# Patient Record
Sex: Male | Born: 1960 | Race: Black or African American | Hispanic: No | Marital: Married | Smoking: Never smoker
Health system: Southern US, Community
[De-identification: ages and names within clinical notes are randomized; demographics above are authoritative.]

---

## 1998-11-10 ENCOUNTER — Emergency Department (HOSPITAL_COMMUNITY): Admission: EM | Admit: 1998-11-10 | Discharge: 1998-11-10 | Payer: Self-pay | Admitting: Internal Medicine

## 2000-03-26 ENCOUNTER — Inpatient Hospital Stay (HOSPITAL_COMMUNITY): Admission: EM | Admit: 2000-03-26 | Discharge: 2000-03-28 | Payer: Self-pay | Admitting: Emergency Medicine

## 2000-03-26 ENCOUNTER — Encounter: Payer: Self-pay | Admitting: Emergency Medicine

## 2000-03-27 ENCOUNTER — Encounter: Payer: Self-pay | Admitting: Internal Medicine

## 2003-03-24 ENCOUNTER — Emergency Department (HOSPITAL_COMMUNITY): Admission: EM | Admit: 2003-03-24 | Discharge: 2003-03-24 | Payer: Self-pay | Admitting: Emergency Medicine

## 2003-03-24 ENCOUNTER — Encounter: Payer: Self-pay | Admitting: Emergency Medicine

## 2005-03-12 ENCOUNTER — Ambulatory Visit: Payer: Self-pay | Admitting: Internal Medicine

## 2005-10-30 ENCOUNTER — Ambulatory Visit: Payer: Self-pay | Admitting: Internal Medicine

## 2007-01-14 ENCOUNTER — Ambulatory Visit: Payer: Self-pay | Admitting: Family Medicine

## 2007-08-12 ENCOUNTER — Ambulatory Visit: Payer: Self-pay | Admitting: Internal Medicine

## 2007-08-15 LAB — CONVERTED CEMR LAB
ALT: 18 units/L (ref 0–53)
AST: 18 units/L (ref 0–37)
BUN: 7 mg/dL (ref 6–23)
Basophils Absolute: 0 10*3/uL (ref 0.0–0.1)
Basophils Relative: 0.3 % (ref 0.0–1.0)
CO2: 31 meq/L (ref 19–32)
Calcium: 9.2 mg/dL (ref 8.4–10.5)
Chloride: 101 meq/L (ref 96–112)
Cholesterol: 201 mg/dL (ref 0–200)
Creatinine, Ser: 1 mg/dL (ref 0.4–1.5)
Direct LDL: 126.9 mg/dL
Eosinophils Absolute: 0.1 10*3/uL (ref 0.0–0.6)
Eosinophils Relative: 1.6 % (ref 0.0–5.0)
GFR calc Af Amer: 103 mL/min
GFR calc non Af Amer: 85 mL/min
Glucose, Bld: 99 mg/dL (ref 70–99)
HCT: 43.5 % (ref 39.0–52.0)
HDL: 54.4 mg/dL (ref >39.0)
Hemoglobin: 14.7 g/dL (ref 13.0–17.0)
Lymphocytes Relative: 24.9 % (ref 12.0–46.0)
MCHC: 33.9 g/dL (ref 30.0–36.0)
MCV: 89 fL (ref 78.0–100.0)
Monocytes Absolute: 0.6 10*3/uL (ref 0.2–0.7)
Monocytes Relative: 9.2 % (ref 3.0–11.0)
Neutro Abs: 4.4 10*3/uL (ref 1.4–7.7)
Neutrophils Relative %: 64 % (ref 43.0–77.0)
PSA: 0.63 ng/mL (ref 0.10–4.00)
Platelets: 255 10*3/uL (ref 150–400)
Potassium: 3.9 meq/L (ref 3.5–5.1)
RBC: 4.89 M/uL (ref 4.22–5.81)
RDW: 12.9 % (ref 11.5–14.6)
Sodium: 138 meq/L (ref 135–145)
TSH: 1.34 microintl units/mL (ref 0.35–5.50)
Total CHOL/HDL Ratio: 3.7
Triglycerides: 89 mg/dL (ref 0–149)
VLDL: 18 mg/dL (ref 0–40)
WBC: 6.8 10*3/uL (ref 4.5–10.5)

## 2008-03-30 ENCOUNTER — Encounter (INDEPENDENT_AMBULATORY_CARE_PROVIDER_SITE_OTHER): Payer: Self-pay | Admitting: *Deleted

## 2008-06-13 ENCOUNTER — Ambulatory Visit: Payer: Self-pay | Admitting: Internal Medicine

## 2008-06-13 DIAGNOSIS — J069 Acute upper respiratory infection, unspecified: Secondary | ICD-10-CM | POA: Insufficient documentation

## 2008-06-15 ENCOUNTER — Encounter (INDEPENDENT_AMBULATORY_CARE_PROVIDER_SITE_OTHER): Payer: Self-pay | Admitting: *Deleted

## 2008-08-28 ENCOUNTER — Encounter (INDEPENDENT_AMBULATORY_CARE_PROVIDER_SITE_OTHER): Payer: Self-pay | Admitting: *Deleted

## 2008-08-31 ENCOUNTER — Encounter: Payer: Self-pay | Admitting: Internal Medicine

## 2008-09-14 ENCOUNTER — Telehealth (INDEPENDENT_AMBULATORY_CARE_PROVIDER_SITE_OTHER): Payer: Self-pay | Admitting: *Deleted

## 2008-10-05 ENCOUNTER — Telehealth (INDEPENDENT_AMBULATORY_CARE_PROVIDER_SITE_OTHER): Payer: Self-pay | Admitting: *Deleted

## 2008-10-30 ENCOUNTER — Telehealth (INDEPENDENT_AMBULATORY_CARE_PROVIDER_SITE_OTHER): Payer: Self-pay | Admitting: *Deleted

## 2008-11-02 ENCOUNTER — Telehealth (INDEPENDENT_AMBULATORY_CARE_PROVIDER_SITE_OTHER): Payer: Self-pay | Admitting: *Deleted

## 2011-04-24 NOTE — H&P (Signed)
[place]  Patient:    Chris Flores, Chris Flores                         MRN: [id number] Adm. Date:  [id number] Attending:  Lorre Nick CC:         Meredith Staggers, M.D.                         History and Physical  DATE OF BIRTH:  [birth year]  PROBLEM LIST: 1. Sepsis versus severe dehydration associated with hypotension. 2. Pulmonary infiltrates of unknown etiology.    a. Infectious versus bronchitis obliterans with organized pneumonia versus       vasculitis versus pneumonitis/acute lung injury (autoimmune), congestive       heart failure, sarcoid, versus marked atypical malignancy. 3. Atypical chest pain, rule out myocardial infarction.    a. Probable pulmonary and/or gastrointestinal etiology. 4. Dehydration. 5. Diarrhea. 6. Hypokalemia secondary to #4 and #5.  CHIEF COMPLAINT:  Chest pain, cough.  HISTORY OF PRESENT ILLNESS:  Chris Flores is a very pleasant 50 year old male who woke up this morning with substernal chest pain radiating to the back.  The patient was brought to the emergency department, where concerns about source with acute MI were considered.  A cardiologist evaluated the patient, who thought that the patients symptoms were not cardiac.  The emergency department obtained a chest CT scan and CT of the lower extremities, which revealed no deep venous thrombosis nor central pulmonary emboli.  Instead, this imaging study showed bibasilar ground glass opacities with bronchi thickening.  Prior to the CT scan, the patient became hypotensive with a systolic blood pressure in the upper 80s associated with bradycardia.  The patient also threw up twice in the emergency department.  After fluid resuscitation, the patients blood pressure improved.  Then, we were called to see this patient for further evaluation and admission.  Chris Flores describes about a three- to four-week history of cough.  The cough was mostly dry though occasionally he  was able to produce yellowish sputum.  In the last two to three weeks, the patient describes no fever or chills.  He describes some diarrhea for the last three days.  This diarrhea was mostly watery.  He describes about three episodes per day.  No melena, tarry stools, or bright red blood per rectum.  No hemoptysis or hematemesis.  No night sweats.  No weight loss.  No sinus congestion.  The patient denies skin rash or synovitis. He is not aware of being exposed to anybody who could have tuberculosis.  He denies IV drug abuse, blood transfusion, or sexual risk factors.  He denies headache or neck stiffness.  He describes some shortness of breath over the last two weeks.  He also describes somewhat worsening cough at night with questionable orthopnea.  The chest pain that he presented with was mostly associated with deep inspiration.  This chest pain was nonexertional.  No history of diabetes, hypertension, smoking.  No family medical history significant for coronary artery disease.  He does not know what his lipid profile is.  No recent lower extremity edema nor trauma.  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  He has not had any work-related chemical exposures that he knows of.  PAST MEDICAL HISTORY:  As problem list.  ALLERGIES:  None.  CURRENT MEDICATIONS:  None.  FAMILY HISTORY:  Significant for colon cancer (mother), lymphoma (grandmother).  No coronary artery disease, hypertension, strokes in the family.  His grandmother had diabetes.  SOCIAL HISTORY:  The patient is married and has three children ages 70, 27, and 1.  He denies smoking, alcohol use, or illegal drug use.  He works as a Museum/gallery exhibitions officer.  REVIEW OF SYSTEMS:  As in the HPI.  No focal weakness, swallowing problems, blurred vision, presyncope, or syncope.  PHYSICAL EXAMINATION:  VITAL SIGNS:  Temperature 98.2, blood  pressure 194/54, heart rate 54, respirations 28, oxygen saturation 96% on 2 L.  Repeat blood pressure after IV fluids 110/60, heart rate 79.  HEENT:  Normocephalic, atraumatic.  Non-icteric sclerae.  Conjunctivae within normal limits.  PERRLA, EOMI.  Funduscopic exam negative for papilledema or hemorrhages.  TMs within normal limits.  Oropharynx clear.  Mucous membranes dry.  NECK:  Supple.  No JVD, no bruits, no adenopathy, no thyromegaly.  SKIN:  Without rash.  JOINTS:  Without synovitis.  LUNGS:  Very fine rales at the bases.  Otherwise, negative.  No wheezing. Fair air movement bilaterally.  CARDIAC:  Regular rate and rhythm without murmurs, rubs, or gallops.  Normal S1, S2.  ABDOMEN:  Flat, nontender, nondistended.  Bowel sounds were present.  No hepatosplenomegaly.  No bruits, no masses, no rebound.  GU:  Within normal limits  RECTAL:  Deferred.  EXTREMITIES:  No edema, clubbing, or cyanosis.  Pulses 2+ bilaterally.  NEUROLOGIC:  Alert and oriented x 3.  Strength 5/5 in all extremities.  DTRs 3/5 in all extremities.  Sensory intact.  Cranial nerves II-XII intact. Plantar reflexes downgoing bilaterally.  LABORATORY DATA:  Hemoglobin 14.4, MCV 85, WBC 11.5, absolute neutrophil count 6.8, absolute lymphocyte count 3.5, eosinophils 3% (absolute eosinophil count 0.3).  Creatinine 1.0.  CK-MB negative, troponin-I negative.  Glucose 123, BUN 9, sodium 141, potassium 3.2, chloride 102, CO2 34.  EKG consistent with sinus bradycardia with a ventricular rate of 54.  Normal axis.  No ST segment changes.  No Q waves.  Early R wave progression.  There is a J point elevation from V3-6.  Chest x-ray consistent with bibasilar streaky infiltrates.  Chest CT scan consistent with no evidence of pulmonary emboli or DVT in the lower extremities.  There is bibasilar ground glass opacities with mid airway thickening.  ASSESSMENT AND PLAN: 1. Sepsis versus severe dehydration--as described  in the history of present illness section, the patient presented with hypotension and bradycardia.  At  this point, the patients hypotension has resolved with fluid resuscitation. Severe hypovolemic shock plus or minus septicemia and sepsis due to the pulmonary infiltrates are the most likely diagnosis.  The patient is going to be admitted to a telemetry bed for further workup and close monitoring. Please see plan in problem #2.  2. Bilateral pulmonary infiltrates--the differential diagnosis includes infectious process, which the most likely diagnosis would be atypical pneumonia.  The microorganisms associated with atypical pneumonias are tuberculosis, fungi, Mycoplasma, H flu, and human immunodeficiency virus. Bacterial community acquired pneumonia is less likely.  Other etiologies to be considered are bronchitis obliterans with organized pneumonia, vasculitis, pneumonitis with acute lung injury associated with an autoimmune process, sarcoid.  Other etiologies less likely are congestive heart failure and atypical malignancy.  We will admit the patient to a telemetry bed for supportive care.  Blood cultures are being obtained x 2.  A sputum sample for gram stain, culture,  acid-fast bacilli, and fungal studies also will be obtained.  An antinuclear antibody, sedimentation rate, and LDH also will be obtained.  A human immunodeficiency virus enzyme-linked immunosorbent assay will be checked today.  A purified protein derivative skin test will also be placed.  We will follow up the patients chest x-ray in the morning.  In the meantime, we will use empiric intravenous antibiotic therapy, bronchodilator treatments.  A pulmonary consult will be obtained in light of the atypical presentation of these pulmonary infiltrates.  3. Chest pain--the chest symptoms are very atypical for a cardiac source. Cardiac enzymes and EKGs are negative.  The probable sources for the chest pain symptoms are likely  to be pulmonary or gastrointestinal.  For now, we will pursue the workup described in problem #2 and we will use proton pump inhibitors in the meantime.  As described above, there is no evidence of pulmonary emboli, nor does the patient have any cardiac risk factors.  4. Dehydration--the physical exam is consistent with moderate to severe degree of dehydration.  We will start intravenous hydration therapy.  The fluid balance will be monitored daily, as well as the orthostatic blood pressures.  5. Hypokalemia--the basis for this low serum potassium level are dehydration and diarrhea.  We will go ahead and start potassium supplementation and recheck the serum potassium level in the morning.  6. Diarrhea--it is unclear if the diarrhea is associated at this point with the pulmonary infiltrates.  The patient denies any recent exposure to antibiotics.  No foreign trips.  We will follow clinically and, if the patient relapses, we will obtain studies to rule out infectious colitis. DD:  03/26/00 TD:  03/26/00 Job: [id number]

## 2011-04-24 NOTE — Discharge Summary (Signed)
[place]. [facility]  Patient:    Chris Flores, Chris Flores                           MRN: [id number] Adm. Date:  03/26/00 Disc. Date: 03/28/00 Attending:  Thora Lance, M.D. CC:         Rosanne Sack, M.D.             Danice Goltz, M.D.             Meredith Staggers, M.D.                           Discharge Summary  REASON FOR ADMISSION:  A 50 year old white male who woke up the morning of admission with substernal chest pain radiating to the back.  He complained of shortness of breath and a cough which was mostly dry although occasionally able to produce some yellowish sputum.  He denied any fever or chills.  He had had some diarrhea which was mainly watery.  He did report some mild shortness of breath over the 2 weeks prior to admission.  The chest pain was associated with deep inspiration.  SIGNIFICANT FINDINGS:  Temperature 98.2, blood pressure 94/54, heart rate 54, respirations 28, oxygen saturation 96% on 2 liters.  HEENT:  Unremarkable.  NECK:  Normal.  LUNGS:  Showed fine rales at the bases otherwise negative without wheezing.  HEART:  Cardiac unremarkable.  ABDOMEN:  Unremarkable.  EXTREMITIES:  Normal.  NEUROLOGIC:  Normal.  LABORATORY DATA:  CBC:  WBC 11.5, hemoglobin 14.4, platelet count 296.  Sodium 141, potassium 3.2, chloride 102, bicarbonate 30, glucose 123, BUN 9, creatinine 1.0, calcium 9.0, total protein 6.4, albumin 3.1, AST 18, ALT 14, alkaline phosphatase 44, total bilirubin 1.6, ______ 201.  Lipase 18.  CK 180, CK-MB less than 0.3, troponin I less than 0.03.  Chest x-ray showed minimal bibasilar atelectasis versus infiltrate and low lung volumes.  HOSPITAL COURSE: #1 - PULMONARY INFILTRATES:  The patient was admitted with shortness of breath, chest pain and cough.  A CT scan of the chest was done on the day of admission and showed bibasilar ill-defined ground-glass opacities.  There was no evidence of pulmonary  embolus.  A CT scan of the lower extremities showed no evidence of DVT.  Other differential was broad and felt to include atypical acute pneumonia, pulmonary edema, and inflammatory causes such as BOOP and DIP.  The patient was started on levaquin IV.  Pulmonary consultation was obtained.  They outlined the differential as noted above.  They recommended an echocardiogram to rule out cardiac dysfunction but this was not completed prior to discharge.  They agree with the antibiotics.  The patient had an HIV which was nonreactive.  The sed rate was 3.  Blood cultures remained no growth.  Urinary lesion ______ was pending.  Other pending labs included an ANA and sputum cultures.  A repeat chest x-ray showed no change.  The patient was noted to have some bronchospasm which responded to nebulized bronchodilators.  On the second hospital day on antibiotics he was feeling much better.  His blood gas on room air looked good with a pH of 7.36, PCO2 of 44, and PO2 of 80. The ambulated in the halls and IV fluids were discontinued.  He tolerated his diet well.  By March 28, 2000, after 2 days of antibiotics, he was feeling back to baseline with  resolution of most of his symptoms and less weakness.  He was discharged in good condition.  DISCHARGE DIAGNOSES: 1. Atypical pneumonia. 2. Hypokalemia. 3. Hypotension. 4. Diarrhea. 5. Mild dehydration.  PROCEDURES: 1. CT scan of the chest. 2. CT scan of the lower extremities.  DISCHARGE MEDICATIONS:  Levaquin 500 mg p.o. q. day for 1 week.  DIET:  Regular.  ACTIVITY:  As tolerated.  FOLLOWUP:  The patient was asked to follow up with Dr. Jayme Cloud of [facility] pulmonology in 2 weeks. DD:  03/29/00 TD:  03/29/00 Job: [id number]

## 2011-04-24 NOTE — Consult Note (Signed)
[place]. [facility]  Patient:    Chris Flores, Chris Flores                         MRN: [id number] Adm. Date:  [id number] Attending:  Lorre Nick Dictator:   Jesse Sans. Wall, M.D. [facility]                          Consultation Report  Dr. Bruce Donath asked me to see this patient at 7 a.m.  HISTORY OF PRESENT ILLNESS:  Chris Flores is a 50 year old black male who awoke suddenly at 4 a.m. with a well localized sharp mid substernal chest pain.  According to his wife, he has had about a three week of an upper respiratory tract infection with severe coughing.  It hurts to take a deep breath.  In the emergency room, he was found to be in distress, difficult to take a history from, and his vital signs were stable.  His EKG showed a question of early repolarization versus an acute anterior infarct.  PAST MEDICAL HISTORY:  He has no cardiac risk factors.  He has had no exertional angina or exertional ischemic symptoms.  He denies any nausea, vomiting, hematochezia, hematemesis, or melena.  PHYSICAL EXAMINATION:  GENERAL:  When I arrived, he was quite comfortable laying on the stretcher with his legs crossed.  VITAL SIGNS:  Blood pressure of 130/80, pulse 64 in sinus rhythm.  SKIN:  Warm and dry.  NECK:  Carotid upstrokes were equal bilaterally without bruits.  CHEST:  No obvious tenderness.  It hurt to take a deep breath.  CARDIAC:  A normal S1 and a split S2.  There was no rub.  He was listened to upright and there was no rub.  LUNGS:  Good breath sounds throughout.  There was no rub.  ABDOMEN:  Soft.  EXTREMITIES:  No edema.  Pulses were present.  LABORATORY DATA:  His chest x-ray showed a normal cardiac silhouette with a question of some atelectasis in the right base.  His EKG was checked several times.  It shows normal sinus rhythm with early repolarization changes in almost every single lead.  There was no PR depression.  There was no obvious low voltage.   There was no electrical alternans.  Laboratory data showed an [facility] that showed a potassium of 3.2.  His pH was 7.3 with PCO2 of 61.  His hemoglobin was 15.  ASSESSMENT: 1. Musculoskeletal chest pain.  Doubt cardiac etiology. 2. Right atelectasis on his chest x-ray, probably from not taking deep breath    from pleuritic or musculoskeletal chest pain from an upper respiratory    tract infection. 3. Hypokalemia. 4. Unexplained acidosis with CO2 retention.  RECOMMENDATIONS: 1. The patient does not need to go to the cath lab for emergent    catheterization. 2. We will pursue other etiologies other than cardiac chest pain. 3. I would recommend the patient gets a PA and lateral chest x-ray.  I do not    think an echocardiogram will be helpful. DD:  03/26/00 TD:  03/26/00 Job: [id number]

## 2012-03-15 ENCOUNTER — Encounter (HOSPITAL_BASED_OUTPATIENT_CLINIC_OR_DEPARTMENT_OTHER): Payer: Self-pay | Admitting: Emergency Medicine

## 2012-03-15 ENCOUNTER — Emergency Department (HOSPITAL_BASED_OUTPATIENT_CLINIC_OR_DEPARTMENT_OTHER)
Admission: EM | Admit: 2012-03-15 | Discharge: 2012-03-15 | Disposition: A | Discharge status: Home / Self Care | Payer: BC Managed Care – PPO | Attending: Emergency Medicine | Admitting: Emergency Medicine

## 2012-03-15 ENCOUNTER — Emergency Department (INDEPENDENT_AMBULATORY_CARE_PROVIDER_SITE_OTHER): Payer: BC Managed Care – PPO

## 2012-03-15 DIAGNOSIS — R5381 Other malaise: Secondary | ICD-10-CM

## 2012-03-15 DIAGNOSIS — R059 Cough, unspecified: Secondary | ICD-10-CM

## 2012-03-15 DIAGNOSIS — R05 Cough: Secondary | ICD-10-CM

## 2012-03-15 DIAGNOSIS — R509 Fever, unspecified: Secondary | ICD-10-CM

## 2012-03-15 DIAGNOSIS — J189 Pneumonia, unspecified organism: Secondary | ICD-10-CM | POA: Insufficient documentation

## 2012-03-15 DIAGNOSIS — J3489 Other specified disorders of nose and nasal sinuses: Secondary | ICD-10-CM | POA: Insufficient documentation

## 2012-03-15 DIAGNOSIS — R918 Other nonspecific abnormal finding of lung field: Secondary | ICD-10-CM

## 2012-03-15 DIAGNOSIS — R5383 Other fatigue: Secondary | ICD-10-CM

## 2012-03-15 MED ORDER — AZITHROMYCIN 250 MG PO TABS: 500.0000 mg | ORAL_TABLET | Freq: Every day | ORAL | Status: AC

## 2012-03-15 MED ORDER — AZITHROMYCIN 250 MG PO TABS
500.0000 mg | ORAL_TABLET | Freq: Once | ORAL | Status: AC
  Administered 2012-03-15: 500 mg via ORAL
  Filled 2012-03-15: qty 2

## 2012-03-15 NOTE — Discharge Instructions (Signed)

## 2012-03-15 NOTE — ED Notes (Signed)
Prescript called in for Albuteral and areo chamber at [facility] [address] [employer]

## 2012-03-15 NOTE — ED Notes (Signed)
Pt c/o cough, chest congestion, and fever x 2 days. Pt states he took tylenol prior to coming.

## 2012-03-15 NOTE — ED Provider Notes (Addendum)
History     CSN: [id number]  Arrival date & time 03/15/12  2034   First MD Initiated Contact with Patient 03/15/12 2239      Chief Complaint  Patient presents with  . URI    (Consider location/radiation/quality/duration/timing/severity/associated sxs/prior treatment) HPI The patient presents with cough, congestion, fever, generalized discomfort.  His symptoms began gradually approximately 3 or 4 days ago, and has become more persistent over the past 2 days.  He notes that he has not achieved significant relief with any OTC medication.  He denies any confusion, disorientation, ataxia, chest pain when not and coughing spells.  He also denies any nausea, vomiting, diarrhea.  History reviewed. No pertinent past medical history.  History reviewed. No pertinent past surgical history.  No family history on file.  History  Substance Use Topics  . Smoking status: Never Smoker   . Smokeless tobacco: Not on file  . Alcohol Use: No      Review of Systems  Constitutional:       Per HPI, otherwise negative  HENT:       Per HPI, otherwise negative  Eyes: Negative.   Respiratory:       Per HPI, otherwise negative  Cardiovascular:       Per HPI, otherwise negative  Gastrointestinal: Negative for vomiting.  Genitourinary: Negative.   Musculoskeletal:       Per HPI, otherwise negative  Skin: Negative.   Neurological: Negative for syncope.    Allergies  Review of patient's allergies indicates no known allergies.  Home Medications   Current Outpatient Rx  Name Route Sig Dispense Refill  . ACETAMINOPHEN 500 MG PO TABS Oral Take 750 mg by mouth every 6 (six) hours as needed. For pain    . ADULT MULTIVITAMIN W/MINERALS CH Oral Take 1 tablet by mouth daily.      BP 113/69  Pulse 83  Temp(Src) 99.9 F (37.7 C) (Oral)  Resp 18  Ht 5\' 7"  (1.702 m)  Wt 195 lb (88.451 kg)  BMI 30.54 kg/m2  SpO2 97%  Physical Exam  Nursing note and vitals reviewed. Constitutional: He is  oriented to person, place, and time. He appears well-developed. No distress.  HENT:  Head: Normocephalic and atraumatic.  Eyes: Conjunctivae and EOM are normal.  Cardiovascular: Normal rate and regular rhythm.   Pulmonary/Chest: Effort normal. No stridor. No respiratory distress. He has decreased breath sounds.  Abdominal: He exhibits no distension.  Musculoskeletal: He exhibits no edema.  Neurological: He is alert and oriented to person, place, and time.  Skin: Skin is warm and dry.  Psychiatric: He has a normal mood and affect.    ED Course  Procedures (including critical care time)  Labs Reviewed - No data to display Dg Chest 2 View  03/15/2012  *RADIOLOGY REPORT*  Clinical Data: Cough, fever and fatigue.  CHEST - 2 VIEW  Comparison: None.  Findings: The lungs are well-aerated.  Mild right perihilar airspace opacification raises concern for pneumonia.  There is no evidence of pleural effusion or pneumothorax.  The heart is normal in size; the mediastinal contour is within normal limits.  No acute osseous abnormalities are seen.  IMPRESSION: Mild right perihilar airspace opacification raises concern for pneumonia.  Original Report Authenticated By: Tonia Ghent, M.D.     No diagnosis found.    MDM  This previously well male presents with several days of cough, congestion and fever.  On exam the patient is in no distress though he is diminished lung  sounds.  The patient's x-rays consistent with pneumonia.  Given the patient's lack of contact with health care facility, he will be treated for community-acquired pneumonia.  I discussed the diagnosis, return precautions, and the need for close outpatient followup with the patient and his wife.  He was discharged in stable condition.  (He was also prescribed an inhaler (via telephone with pharmacy).       Gerhard Munch, MD 03/15/12 2259  Gerhard Munch, MD 03/15/12 [phone]

## 2013-02-05 ENCOUNTER — Ambulatory Visit: Payer: BC Managed Care – PPO

## 2013-02-05 ENCOUNTER — Ambulatory Visit (INDEPENDENT_AMBULATORY_CARE_PROVIDER_SITE_OTHER): Payer: BC Managed Care – PPO | Admitting: Internal Medicine

## 2013-02-05 VITALS — BP 123/82 | HR 73 | Temp 97.7°F | Resp 18 | Ht 67.0 in | Wt 197.8 lb

## 2013-02-05 DIAGNOSIS — S92912A Unspecified fracture of left toe(s), initial encounter for closed fracture: Secondary | ICD-10-CM

## 2013-02-05 DIAGNOSIS — M79675 Pain in left toe(s): Secondary | ICD-10-CM

## 2013-02-05 DIAGNOSIS — M79609 Pain in unspecified limb: Secondary | ICD-10-CM

## 2013-02-05 DIAGNOSIS — M79674 Pain in right toe(s): Secondary | ICD-10-CM

## 2013-02-05 NOTE — Progress Notes (Signed)
  Subjective:    Patient ID: Chris Flores, male    DOB: 07-09-1961, 52 y.o.   MRN: [id number]  HPI Struck toe 5 days ago. Still painful  Review of Systems     Objective:   Physical Exam 5th left toe in good position swollen and painful NMSV intact.  [facility] reading (PRIMARY) by  Dr.Guest spiral fx good position proximal 5th       Assessment & Plan:  RICE/Camwlker 3 weeks  rtc if not well and re xr

## 2013-03-08 ENCOUNTER — Ambulatory Visit (INDEPENDENT_AMBULATORY_CARE_PROVIDER_SITE_OTHER): Payer: BC Managed Care – PPO | Admitting: Family Medicine

## 2013-03-08 ENCOUNTER — Ambulatory Visit: Payer: BC Managed Care – PPO

## 2013-03-08 VITALS — BP 118/75 | HR 75 | Temp 98.5°F | Resp 16 | Ht 67.0 in | Wt 203.0 lb

## 2013-03-08 DIAGNOSIS — S92911D Unspecified fracture of right toe(s), subsequent encounter for fracture with routine healing: Secondary | ICD-10-CM

## 2013-03-08 DIAGNOSIS — S8290XD Unspecified fracture of unspecified lower leg, subsequent encounter for closed fracture with routine healing: Secondary | ICD-10-CM

## 2013-03-08 NOTE — Patient Instructions (Addendum)
Please go back to buddy taping the pinky toe to the 4th toe.  Let me know if your toe does not stop hurting in the next couple of weeks- Sooner if worse.

## 2013-03-08 NOTE — Progress Notes (Signed)
[facility] [address] [place] [address] [phone]  Date:  03/08/2013   Name:  Chris Flores   DOB:  [birth year]   MRN:  [id number]  PCP:  Willow Ora, MD    Chief Complaint: Follow-up   History of Present Illness:  Chris Flores is a 52 y.o. very pleasant male patient who presents with the following:  5 or 6 weeks ago he fractured his right 5th toe- he was noted to have a spiral fracture on x-ray at his visit on 02/05/2013.   The toe still hurts by the end of the day, but is overall better.  He was not sure if it needed to be rechecked.   Of note, his last note and x-ray report documents the problem in his LEFT foot- however, the patient indicates that the problem is with his RIGHT foot.    Patient Active Problem List  Diagnosis  . URI    History reviewed. No pertinent past medical history.  History reviewed. No pertinent past surgical history.  History  Substance Use Topics  . Smoking status: Never Smoker   . Smokeless tobacco: Not on file  . Alcohol Use: No    History reviewed. No pertinent family history.  No Known Allergies  Medication list has been reviewed and updated.  Current Outpatient Prescriptions on File Prior to Visit  Medication Sig Dispense Refill  . acetaminophen (TYLENOL) 500 MG tablet Take 750 mg by mouth every 6 (six) hours as needed. For pain      . Multiple Vitamin (MULITIVITAMIN WITH MINERALS) TABS Take 1 tablet by mouth daily.       No current facility-administered medications on file prior to visit.    Review of Systems:  As per HPI- otherwise negative.   Physical Examination: Filed Vitals:   03/08/13 1542  BP: 118/75  Pulse: 75  Temp: 98.5 F (36.9 C)  Resp: 16   Filed Vitals:   03/08/13 1542  Height: 5\' 7"  (1.702 m)  Weight: 203 lb (92.08 kg)   Body mass index is 31.79 kg/(m^2). Ideal Body Weight: Weight in (lb) to have BMI = 25: 159.3   GEN: WDWN, NAD, Non-toxic, Alert & Oriented x 3 HEENT:  Atraumatic, Normocephalic.  Ears and Nose: No external deformity. CV: RRR, no MRG Pulm: CTA bilaterally EXTR: No clubbing/cyanosis/edema NEURO: Normal gait.  PSYCH: Normally interactive. Conversant. Not depressed or anxious appearing.  Calm demeanor.  The right pinky toe is still slightly tender towards the base of the toe.  Normal perfusion and ROM, no swelling, redness or heat  [facility] reading (PRIMARY) by  Dr. Patsy Lager. Right foot: persistent fracture in proximal phalanx of 5th toe.  Of note, the x-ray from 02/05/13 is labeled left foot, but marker on the film says right and pt does endorse that the proper (right) foot was x-rayed last time.  Will call radiology and have this corrected  RIGHT FOOT COMPLETE - 3+ VIEW  Comparison: 02/05/2013  Findings: Again noted nondisplaced fracture of proximal phalanx right fifth toe. No radiopaque foreign body.  IMPRESSION: Again noted nondisplaced fracture proximal phalanx right fifth toe.  Assessment and Plan: Fracture of toe, right, with routine healing, subsequent encounter - Plan: DG Foot Complete Right  persistently tender fractured toe.  Fracture is non- displaced.  Will have him go back to buddy taping the toe.  Suspect the toe will heal soon, but he is to come back if pain is not resolved in the  next 2 weeks  Signed Abbe Amsterdam, MD

## 2014-04-28 IMAGING — CR DG TOE 5TH 2+V*L*
1 series · 1 of 1 positions shown · non-contrast
Comparison: None.

CLINICAL DATA: Small toe injury 5 days ago with persistent pain.

DG TOE 5TH LEFT

[AP]
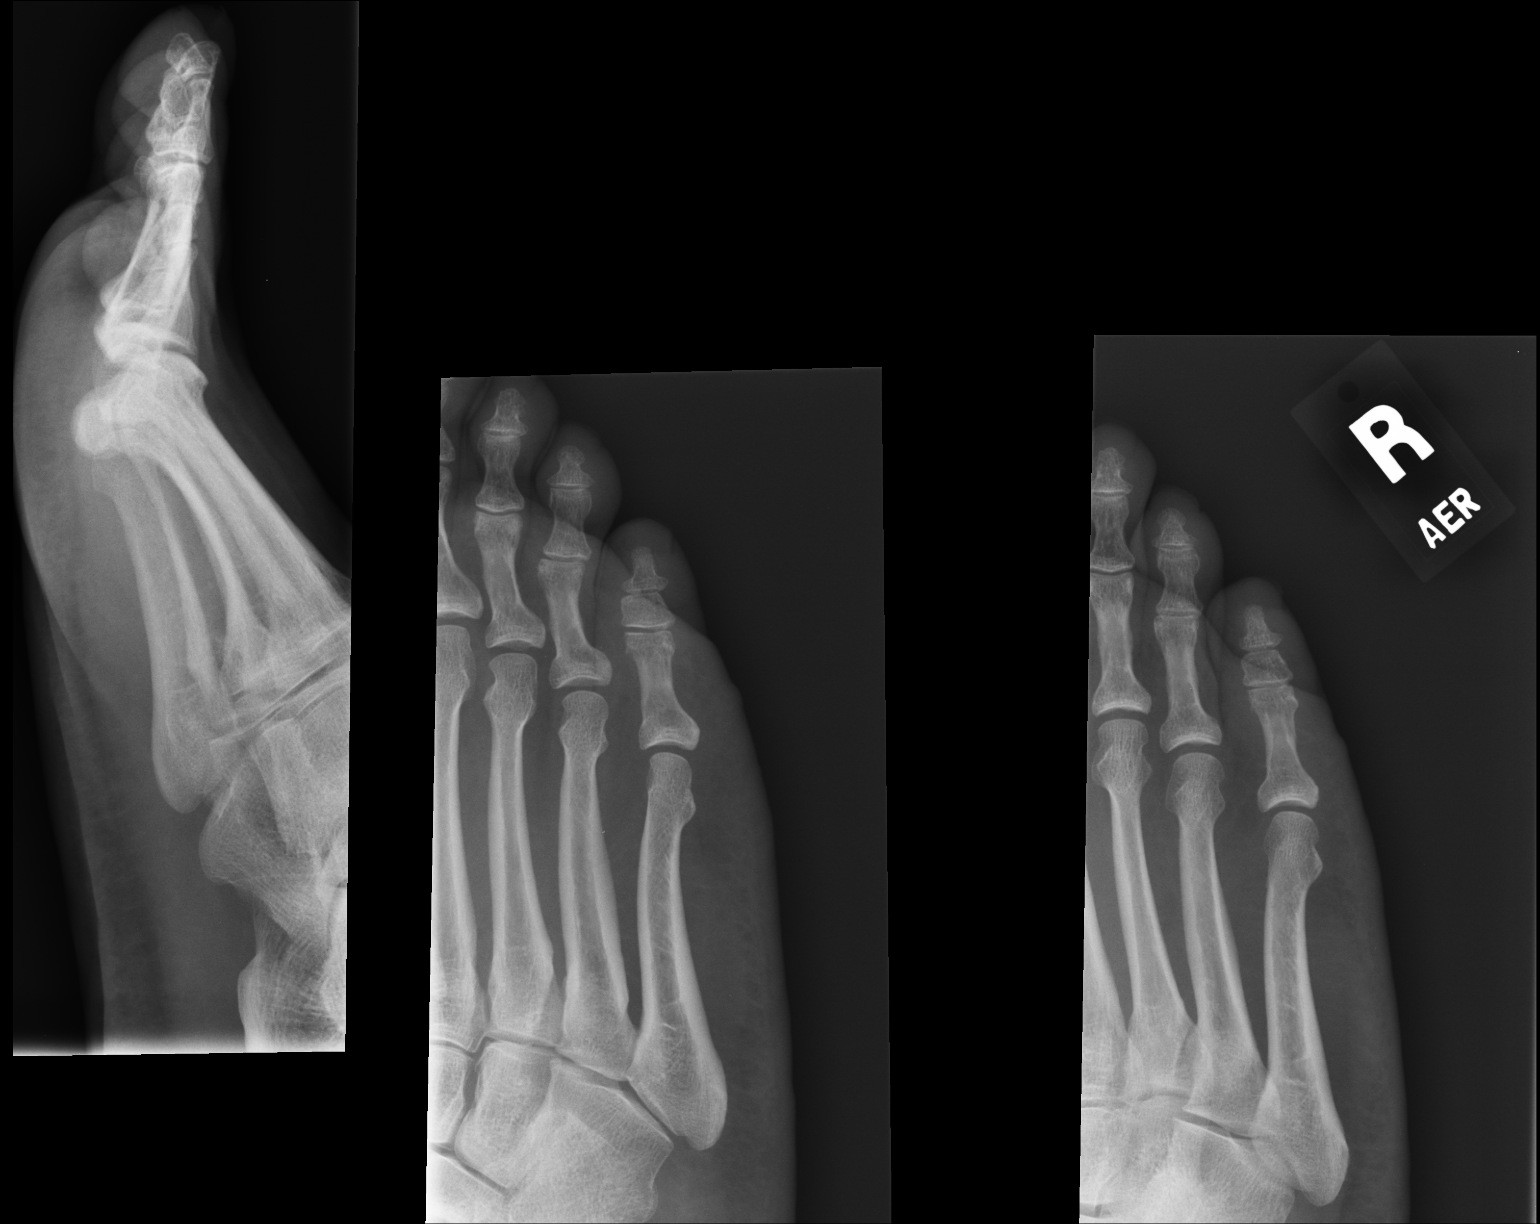

[1 of 1 positions shown; findings below may reference images not displayed]

FINDINGS: There is soft tissue swelling in the small toe.  No
displaced fracture or dislocation is identified.  Preliminary
reading by Dr. Juxtcalme was a fracture of the proximal fifth phalanx.
Although I do not see a definite acute fracture, there is linear
lucency projecting over the lateral cortex of the proximal fifth
phalangeal diaphysis on the oblique view which could reflect a
nondisplaced fracture.
IMPRESSION: Possible (not definite) nondisplaced fracture of the proximal fifth
phalanx.

## 2014-05-29 IMAGING — CR DG FOOT COMPLETE 3+V*R*
3 series · 3 of 3 positions shown · non-contrast
Comparison: 02/05/2013

CLINICAL DATA: Follow up fracture

RIGHT FOOT COMPLETE - 3+ VIEW

[AP]
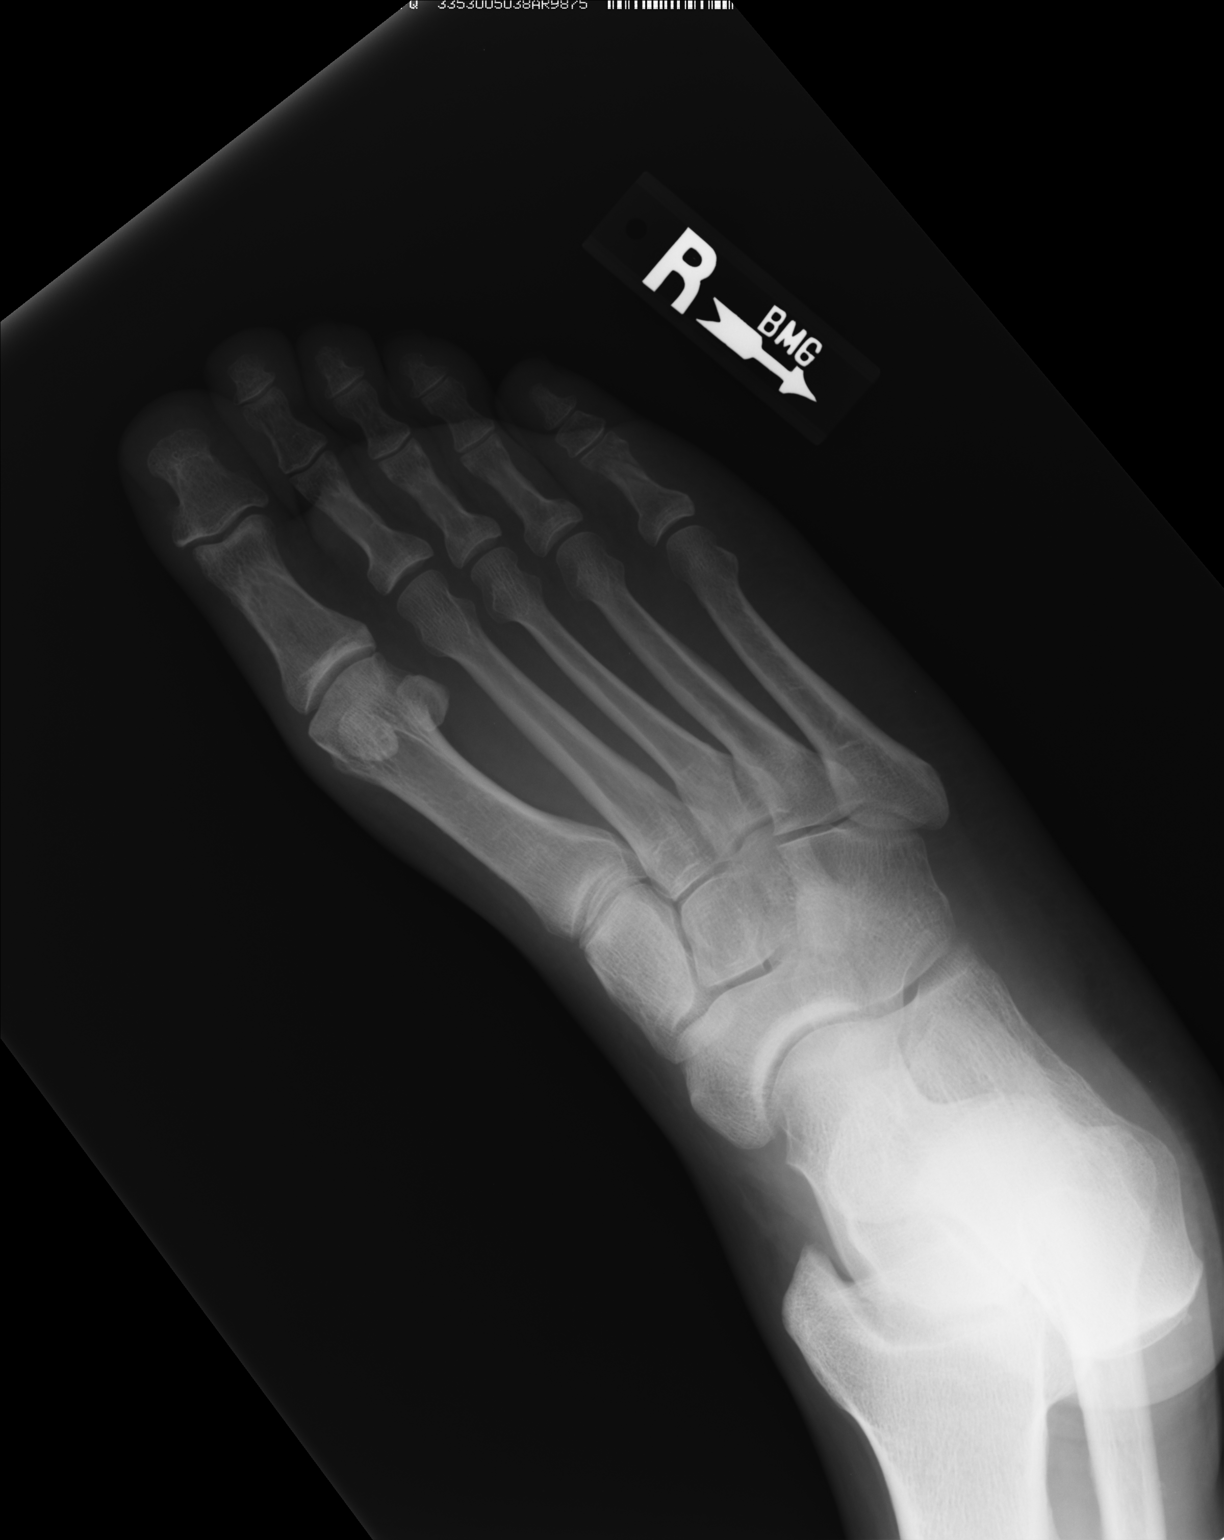

[ap obl int rot]
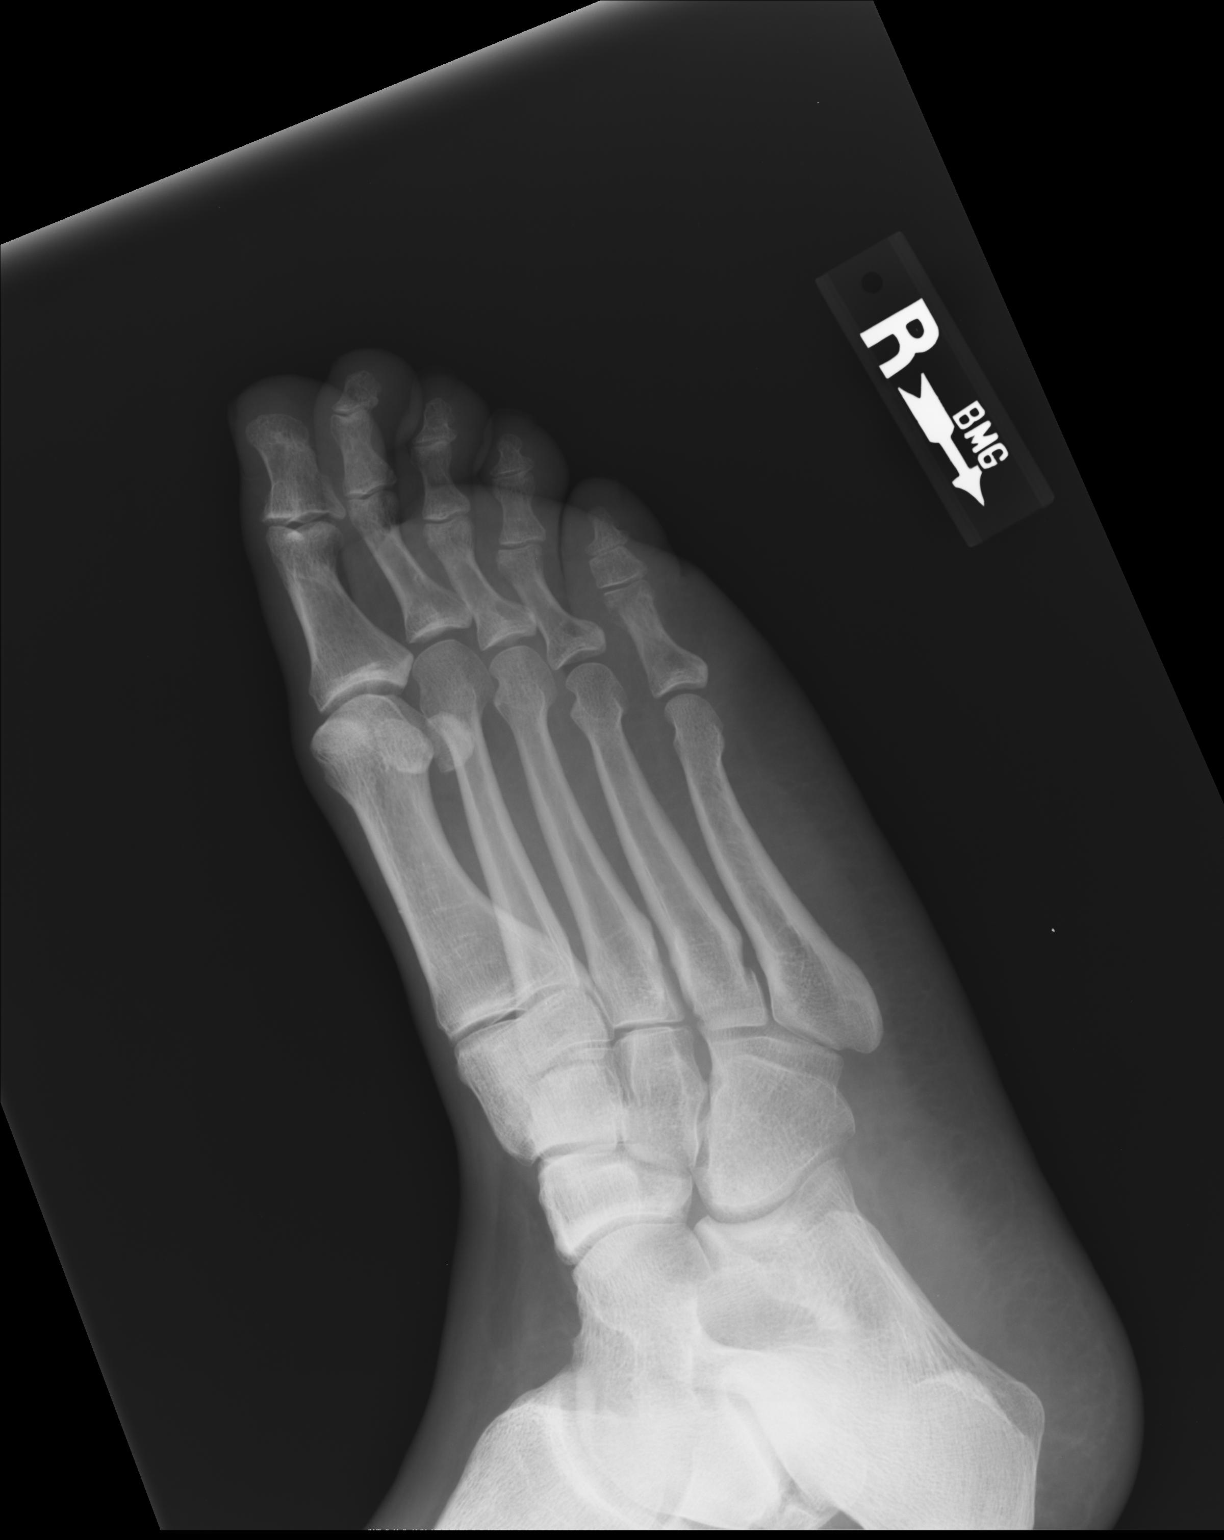

[lateral]
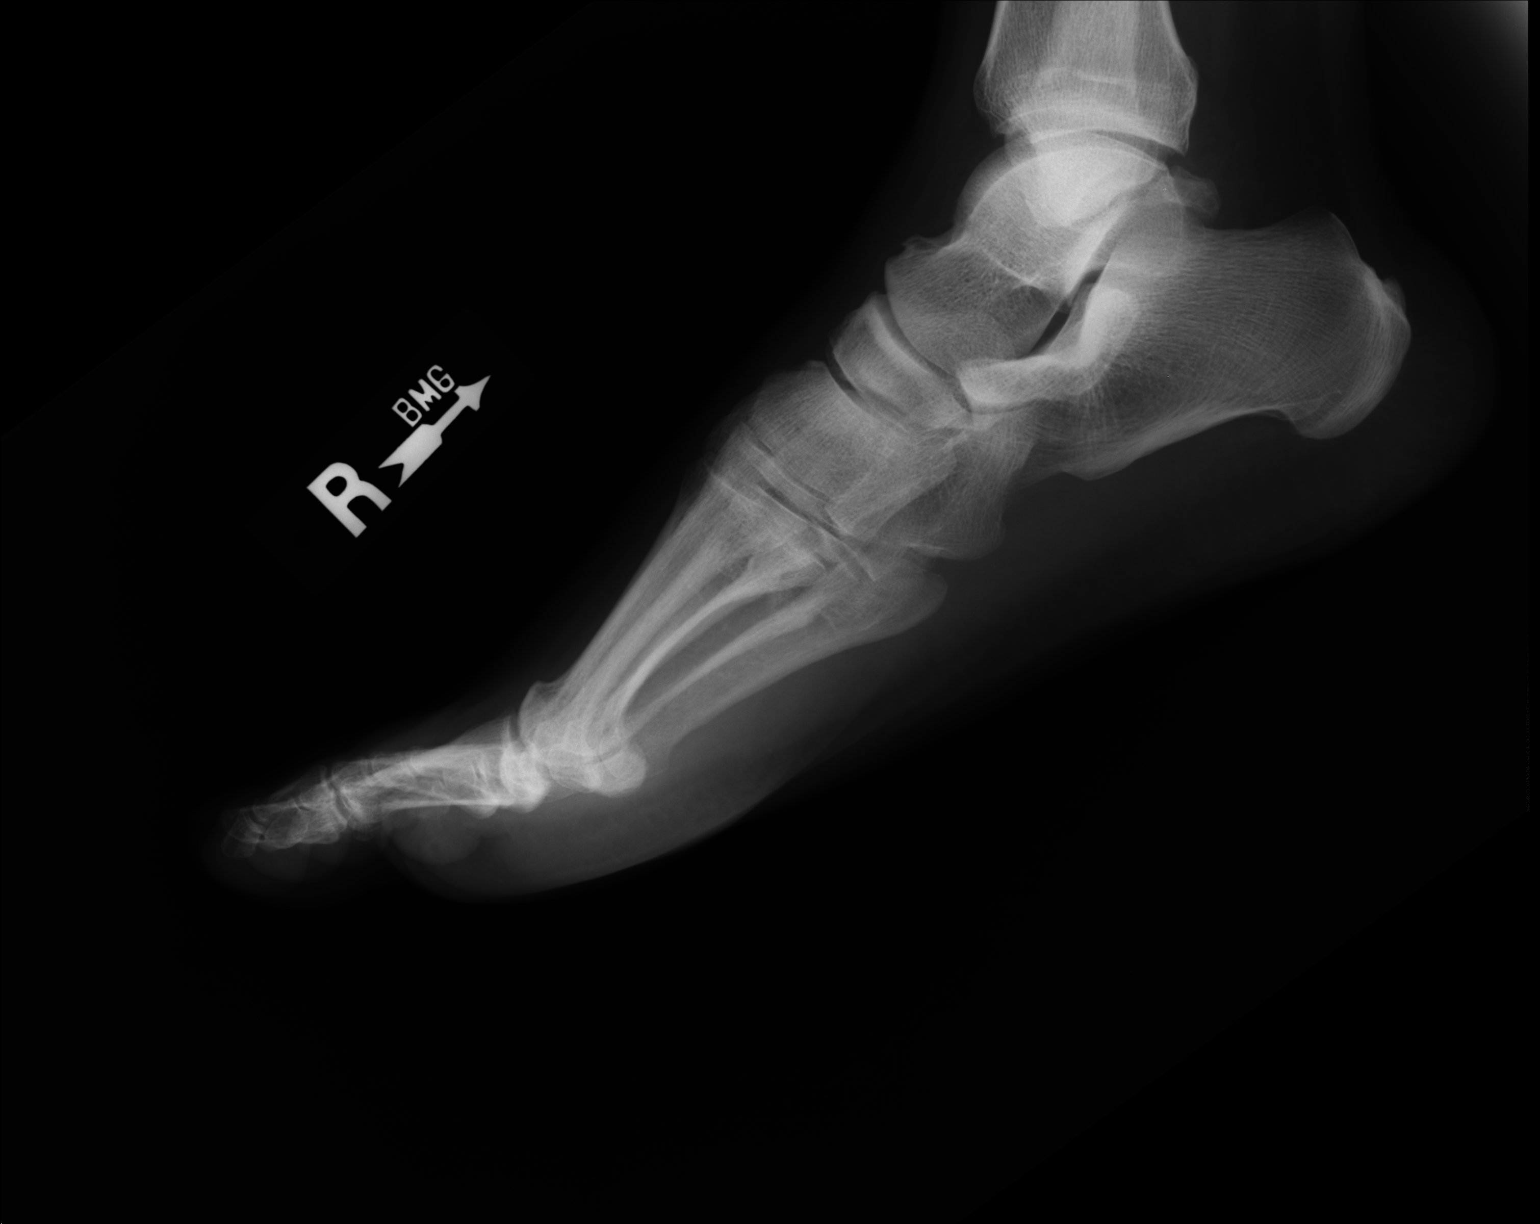

[3 of 3 positions shown; findings below may reference images not displayed]

FINDINGS: Again noted nondisplaced fracture of proximal phalanx
right fifth toe.  No radiopaque foreign body.
IMPRESSION: Again noted nondisplaced fracture proximal phalanx right fifth toe.

## 2018-12-22 ENCOUNTER — Ambulatory Visit: Payer: 59

## 2018-12-22 ENCOUNTER — Ambulatory Visit
Admission: EM | Admit: 2018-12-22 | Discharge: 2018-12-22 | Disposition: A | Discharge status: Home / Self Care | Payer: 59 | Attending: Physician Assistant | Admitting: Physician Assistant

## 2018-12-22 DIAGNOSIS — M546 Pain in thoracic spine: Secondary | ICD-10-CM | POA: Insufficient documentation

## 2018-12-22 MED ORDER — IBUPROFEN 600 MG PO TABS: 600.0000 mg | ORAL_TABLET | Freq: Four times a day (QID) | ORAL | 0 refills | Status: DC | PRN

## 2018-12-22 MED ORDER — METHOCARBAMOL 500 MG PO TABS: 500.0000 mg | ORAL_TABLET | Freq: Two times a day (BID) | ORAL | 0 refills | Status: DC

## 2018-12-22 NOTE — Discharge Instructions (Signed)
Return if any problems.

## 2018-12-22 NOTE — ED Provider Notes (Signed)
[facility]    CSN: [id number] Arrival date & time: 12/22/18  1520     History   Chief Complaint Chief Complaint  Patient presents with  . Back Pain    HPI Chris Flores is a 58 y.o. male.   The history is provided by the patient. No language interpreter was used.  Back Pain  Location:  Generalized Quality:  Aching Radiates to: low back. Pain severity:  Moderate Pain is:  Worse during the night Onset quality:  Sudden Timing:  Constant Progression:  Worsening Chronicity:  New Relieved by:  Nothing Worsened by:  Nothing Ineffective treatments:  None tried Associated symptoms: no numbness     History reviewed. No pertinent past medical history.  Patient Active Problem List   Diagnosis Date Noted  . URI 06/13/2008    History reviewed. No pertinent surgical history.     Home Medications    Prior to Admission medications   Medication Sig Start Date End Date Taking? Authorizing Provider  acetaminophen (TYLENOL) 500 MG tablet Take 750 mg by mouth every 6 (six) hours as needed. For pain    [provider]  ibuprofen (ADVIL,MOTRIN) 600 MG tablet Take 1 tablet (600 mg total) by mouth every 6 (six) hours as needed. 12/22/18   Elson Areas, PA-C  methocarbamol (ROBAXIN) 500 MG tablet Take 1 tablet (500 mg total) by mouth 2 (two) times daily. 12/22/18   Elson Areas, PA-C  Multiple Vitamin (MULITIVITAMIN WITH MINERALS) TABS Take 1 tablet by mouth daily.    [provider]    Family History No family history on file.  Social History Social History   Tobacco Use  . Smoking status: Never Smoker  . Smokeless tobacco: Never Used  Substance Use Topics  . Alcohol use: No  . Drug use: No     Allergies   Patient has no known allergies.   Review of Systems Review of Systems  Musculoskeletal: Positive for back pain.  Neurological: Negative for numbness.  All other systems reviewed and are negative.    Physical Exam Triage  Vital Signs ED Triage Vitals [12/22/18 1547]  Enc Vitals Group     BP 113/82     Pulse Rate 77     Resp 18     Temp 98.1 F (36.7 C)     Temp Source Oral     SpO2 98 %     Weight      Height      Head Circumference      Peak Flow      Pain Score 2     Pain Loc      Pain Edu?      Excl. in GC?    No data found.  Updated Vital Signs BP 113/82 (BP Location: Left Arm)   Pulse 77   Temp 98.1 F (36.7 C) (Oral)   Resp 18   SpO2 98%   Visual Acuity Right Eye Distance:   Left Eye Distance:   Bilateral Distance:    Right Eye Near:   Left Eye Near:    Bilateral Near:     Physical Exam Vitals signs and nursing note reviewed.  Constitutional:      Appearance: He is well-developed.  HENT:     Head: Normocephalic and atraumatic.     Nose: Nose normal.     Mouth/Throat:     Mouth: Mucous membranes are moist.  Eyes:     Conjunctiva/sclera: Conjunctivae normal.  Neck:  Musculoskeletal: Normal range of motion and neck supple.  Cardiovascular:     Rate and Rhythm: Normal rate and regular rhythm.     Heart sounds: No murmur.  Pulmonary:     Effort: Pulmonary effort is normal. No respiratory distress.     Breath sounds: Normal breath sounds.     Comments: Mild tenderness mid back  Abdominal:     Palpations: Abdomen is soft.     Tenderness: There is no abdominal tenderness.  Skin:    General: Skin is warm and dry.  Neurological:     General: No focal deficit present.     Mental Status: He is alert.  Psychiatric:        Mood and Affect: Mood normal.      UC Treatments / Results  Labs (all labs ordered are listed, but only abnormal results are displayed) Labs Reviewed - No data to display  EKG None  Radiology Dg Chest 2 View  Result Date: 12/22/2018 CLINICAL DATA:  Cough and upper RIGHT-sided back pain for 1 week EXAM: CHEST - 2 VIEW COMPARISON:  03/15/2012 FINDINGS: Normal heart size, mediastinal contours, and pulmonary vascularity. Lungs clear. No  pleural effusion or pneumothorax. Bones unremarkable. IMPRESSION: No acute abnormalities. Electronically Signed   By: Ulyses Southward M.D.   On: 12/22/2018 16:14    Procedures Procedures (including critical care time)  Medications Ordered in UC Medications - No data to display  Initial Impression / Assessment and Plan / UC Course  I have reviewed the triage vital signs and the nursing notes.  Pertinent labs & imaging results that were available during my care of the patient were reviewed by me and considered in my medical decision making (see chart for details).     MDM  Xray reviewed, Pt counseled on back pain  Final Clinical Impressions(s) / UC Diagnoses   Final diagnoses:  Acute right-sided thoracic back pain     Discharge Instructions     Return if any problems.     ED Prescriptions    Medication Sig Dispense Auth. Provider   methocarbamol (ROBAXIN) 500 MG tablet Take 1 tablet (500 mg total) by mouth 2 (two) times daily. 20 tablet Roniyah Llorens K, [place]   ibuprofen (ADVIL,MOTRIN) 600 MG tablet Take 1 tablet (600 mg total) by mouth every 6 (six) hours as needed. 30 tablet Elson Areas, [place]     Controlled Substance Prescriptions [place] Controlled Substance Registry consulted? Not Applicable   Elson Areas, [place] 12/22/18 1931

## 2018-12-22 NOTE — ED Triage Notes (Signed)
Pt c/o lower back pain x1wk, worse when driving; denies injury

## 2018-12-26 ENCOUNTER — Other Ambulatory Visit: Payer: Self-pay

## 2018-12-26 ENCOUNTER — Emergency Department (HOSPITAL_COMMUNITY)
Admission: EM | Admit: 2018-12-26 | Discharge: 2018-12-26 | Disposition: A | Discharge status: Home / Self Care | Payer: 59 | Attending: Emergency Medicine | Admitting: Emergency Medicine

## 2018-12-26 DIAGNOSIS — R6884 Jaw pain: Secondary | ICD-10-CM | POA: Diagnosis not present

## 2018-12-26 MED ORDER — CYCLOBENZAPRINE HCL 10 MG PO TABS: 10.0000 mg | ORAL_TABLET | Freq: Two times a day (BID) | ORAL | 0 refills | Status: DC | PRN

## 2018-12-26 MED ORDER — IBUPROFEN 600 MG PO TABS: 600.0000 mg | ORAL_TABLET | Freq: Four times a day (QID) | ORAL | 0 refills | Status: DC | PRN

## 2018-12-26 NOTE — ED Notes (Signed)
ED Provider at bedside. 

## 2018-12-26 NOTE — ED Provider Notes (Signed)
[facility] EMERGENCY DEPARTMENT Provider Note   CSN: [id number] Arrival date & time: 12/26/18  2059     History   Chief Complaint Chief Complaint  Patient presents with  . Jaw Pain    Left    HPI Chris Flores is a 58 y.o. male.  Patient presents to the ED with a chief complaint of left jaw pain.  He states that he was getting ready to eat and felt tightness in his left upper jaw. Worsened when opening his mouth to eat or yawn. Denies any clicking or popping.  States that it feels like his muscle is spasming.  Denies any dental pain or ear pain.  Denies any radiating pain into his neck or chest.  Has not taken anything for his symptoms.    The history is provided by the patient. No language interpreter was used.    No past medical history on file.  Patient Active Problem List   Diagnosis Date Noted  . URI 06/13/2008    No past surgical history on file.      Home Medications    Prior to Admission medications   Medication Sig Start Date End Date Taking? Authorizing Provider  acetaminophen (TYLENOL) 500 MG tablet Take 750 mg by mouth every 6 (six) hours as needed. For pain    [provider]  cyclobenzaprine (FLEXERIL) 10 MG tablet Take 1 tablet (10 mg total) by mouth 2 (two) times daily as needed for muscle spasms. 12/26/18   Roxy Horseman, PA-C  ibuprofen (ADVIL,MOTRIN) 600 MG tablet Take 1 tablet (600 mg total) by mouth every 6 (six) hours as needed. 12/26/18   Roxy Horseman, PA-C  methocarbamol (ROBAXIN) 500 MG tablet Take 1 tablet (500 mg total) by mouth 2 (two) times daily. 12/22/18   Elson Areas, PA-C  Multiple Vitamin (MULITIVITAMIN WITH MINERALS) TABS Take 1 tablet by mouth daily.    [provider]    Family History No family history on file.  Social History Social History   Tobacco Use  . Smoking status: Never Smoker  . Smokeless tobacco: Never Used  Substance Use Topics  . Alcohol use: No  . Drug use: No      Allergies   Patient has no known allergies.   Review of Systems Review of Systems  All other systems reviewed and are negative.    Physical Exam Updated Vital Signs BP 121/78 (BP Location: Right Arm)   Pulse 77   Temp 97.9 F (36.6 C) (Oral)   Resp 16   Ht 5\' 7"  (1.702 m)   Wt 96.2 kg   SpO2 98%   BMI 33.20 kg/m   Physical Exam Vitals signs and nursing note reviewed.  Constitutional:      General: He is not in acute distress.    Appearance: He is not diaphoretic.  HENT:     Head: Normocephalic and atraumatic.     Mouth/Throat:     Comments: Mild tenderness over left TMJ, ROM of mandible is normal, no clicking or popping or locking  Dentition is unremarkable  Left TM is clear  No erythema or sign of infection or abscess Eyes:     Conjunctiva/sclera: Conjunctivae normal.     Pupils: Pupils are equal, round, and reactive to light.  Neck:     Trachea: No tracheal deviation.  Cardiovascular:     Rate and Rhythm: Normal rate.  Pulmonary:     Effort: Pulmonary effort is normal. No respiratory distress.  Abdominal:     General: There is no distension.  Musculoskeletal: Normal range of motion.  Skin:    General: Skin is warm and dry.  Neurological:     Mental Status: He is alert and oriented to person, place, and time.  Psychiatric:        Judgment: Judgment normal.      ED Treatments / Results  Labs (all labs ordered are listed, but only abnormal results are displayed) Labs Reviewed - No data to display  EKG None  Radiology No results found.  Procedures Procedures (including critical care time)  Medications Ordered in ED Medications - No data to display   Initial Impression / Assessment and Plan / ED Course  I have reviewed the triage vital signs and the nursing notes.  Pertinent labs & imaging results that were available during my care of the patient were reviewed by me and considered in my medical decision making (see chart for  details).     Patient symptoms seem to most likely be TMJ related.  Possibly muscle spasm, as he feels like the muscle is spasming when he opens his mouth.  No dislocation.  Dentition is unremarkable.  Ear exam normal.  Tenderness and mild swelling is not overlying the parotid gland doubt parotiditis or mumps or stone.  Final Clinical Impressions(s) / ED Diagnoses   Final diagnoses:  Jaw pain    ED Discharge Orders         Ordered    cyclobenzaprine (FLEXERIL) 10 MG tablet  2 times daily PRN     12/26/18 2229    ibuprofen (ADVIL,MOTRIN) 600 MG tablet  Every 6 hours PRN     12/26/18 2229           Roxy HorsemanBrowning, Robina Hamor, PA-C 12/26/18 2254    Wynetta FinesMessick, Peter C, MD 12/27/18 [phone]

## 2018-12-26 NOTE — Discharge Instructions (Addendum)
If you have swelling around your teeth or if you develop a fever please return to the ER or see your doctor or dentist.

## 2018-12-26 NOTE — ED Triage Notes (Addendum)
Patient c/o left jaw pain when opening mouth to eat or yawn. States he believes it is also swollen. Denies CP, SOB and no hx of such.

## 2018-12-26 NOTE — ED Notes (Signed)
Pt discharged from ED; instructions provided and scripts given; Pt encouraged to return to ED if symptoms worsen and to f/u with PCP; Pt verbalized understanding of all instructions 

## 2021-06-05 ENCOUNTER — Encounter: Payer: Self-pay | Admitting: *Deleted

## 2021-06-05 ENCOUNTER — Ambulatory Visit
Admission: EM | Admit: 2021-06-05 | Discharge: 2021-06-05 | Disposition: A | Discharge status: Home / Self Care | Payer: Commercial Managed Care - PPO | Attending: Emergency Medicine | Admitting: Emergency Medicine

## 2021-06-05 DIAGNOSIS — M545 Low back pain, unspecified: Secondary | ICD-10-CM

## 2021-06-05 LAB — POCT URINALYSIS DIP (MANUAL ENTRY)
Bilirubin, UA: NEGATIVE
Glucose, UA: NEGATIVE mg/dL
Ketones, POC UA: NEGATIVE mg/dL
Leukocytes, UA: NEGATIVE
Nitrite, UA: NEGATIVE
Protein Ur, POC: NEGATIVE mg/dL
Spec Grav, UA: 1.025 (ref 1.010–1.025)
Urobilinogen, UA: 0.2 E.U./dL
pH, UA: 5.5 (ref 5.0–8.0)

## 2021-06-05 MED ORDER — NAPROXEN 500 MG PO TABS: 500.0000 mg | ORAL_TABLET | Freq: Two times a day (BID) | ORAL | 0 refills | Status: AC

## 2021-06-05 MED ORDER — TIZANIDINE HCL 2 MG PO TABS: 2.0000 mg | ORAL_TABLET | Freq: Four times a day (QID) | ORAL | 0 refills | Status: AC | PRN

## 2021-06-05 MED ORDER — TRIAMCINOLONE ACETONIDE 0.1 % EX CREA: 1.0000 "application " | TOPICAL_CREAM | Freq: Two times a day (BID) | CUTANEOUS | 0 refills | Status: AC

## 2021-06-05 NOTE — ED Provider Notes (Signed)
[facility]    CSN: [id number] Arrival date & time: 06/05/21  7517      History   Chief Complaint Chief Complaint  Patient presents with   Buttock pain    HPI Chris Flores is a 60 y.o. male presenting today for evaluation of left side pain.  Reports symptoms began 1 week ago and has had left-sided buttock pain.  Denies any injury or trauma.  Does report recently with tick bite to the same area approximately 1 month ago-denies fevers, dizziness, lightheadedness, other rashes, abdominal pain nausea vomiting, diarrhea.  Denies any radiation into lower extremities.  Denies paresthesias.   Has also had some frequent urination for approximately 2 weeks without any dysuria or difficulty controlling urination.  Denies taking any medicines for symptoms.  Symptoms are intermittent.  HPI  History reviewed. No pertinent past medical history.  Patient Active Problem List   Diagnosis Date Noted   URI 06/13/2008    History reviewed. No pertinent surgical history.     Home Medications    Prior to Admission medications   Medication Sig Start Date End Date Taking? Authorizing Provider  naproxen (NAPROSYN) 500 MG tablet Take 1 tablet (500 mg total) by mouth 2 (two) times daily. 06/05/21  Yes Nashira Mcglynn C, PA-C  tiZANidine (ZANAFLEX) 2 MG tablet Take 1-2 tablets (2-4 mg total) by mouth every 6 (six) hours as needed for muscle spasms. 06/05/21  Yes Jaelyn Cloninger C, PA-C  triamcinolone cream (KENALOG) 0.1 % Apply 1 application topically 2 (two) times daily. 06/05/21  Yes Breandan People C, PA-C  Multiple Vitamin (MULITIVITAMIN WITH MINERALS) TABS Take 1 tablet by mouth daily.    [provider]    Family History Family History  Problem Relation Age of Onset   Healthy Father     Social History Social History   Tobacco Use   Smoking status: Never   Smokeless tobacco: Never  Vaping Use   Vaping Use: Never used  Substance Use Topics   Alcohol use: No   Drug  use: No     Allergies   Patient has no known allergies.   Review of Systems Review of Systems  Constitutional:  Negative for fatigue and fever.  Eyes:  Negative for redness, itching and visual disturbance.  Respiratory:  Negative for shortness of breath.   Cardiovascular:  Negative for chest pain and leg swelling.  Gastrointestinal:  Negative for nausea and vomiting.  Musculoskeletal:  Positive for back pain and myalgias. Negative for arthralgias.  Skin:  Negative for color change, rash and wound.  Neurological:  Negative for dizziness, syncope, weakness, light-headedness and headaches.    Physical Exam Triage Vital Signs ED Triage Vitals  Enc Vitals Group     BP      Pulse      Resp      Temp      Temp src      SpO2      Weight      Height      Head Circumference      Peak Flow      Pain Score      Pain Loc      Pain Edu?      Excl. in GC?    No data found.  Updated Vital Signs BP 114/80   Pulse 72   Temp 98.3 F (36.8 C) (Temporal)   Resp 16   SpO2 96%   Visual Acuity Right Eye Distance:   Left  Eye Distance:   Bilateral Distance:    Right Eye Near:   Left Eye Near:    Bilateral Near:     Physical Exam Vitals and nursing note reviewed.  Constitutional:      Appearance: He is well-developed.     Comments: No acute distress  HENT:     Head: Normocephalic and atraumatic.     Nose: Nose normal.  Eyes:     Conjunctiva/sclera: Conjunctivae normal.  Cardiovascular:     Rate and Rhythm: Normal rate.  Pulmonary:     Effort: Pulmonary effort is normal. No respiratory distress.  Abdominal:     General: There is no distension.  Musculoskeletal:        General: Normal range of motion.     Cervical back: Neck supple.     Comments: Back: Small hyperpigmented area, slightly papular and raised without surrounding erythema or warmth to left lower back; nontender to palpation along spine midline, mild tenderness palpation to lower lumbar area extending into  upper sacral/gluteal area on left Strength 5/5 and equal bilaterally, patellar reflex 2+, negative straight leg raise, gait without abnormality  Skin:    General: Skin is warm and dry.  Neurological:     Mental Status: He is alert and oriented to person, place, and time.     UC Treatments / Results  Labs (all labs ordered are listed, but only abnormal results are displayed) Labs Reviewed  POCT URINALYSIS DIP (MANUAL ENTRY) - Abnormal; Notable for the following components:      Result Value   Blood, UA trace-intact (*)    All other components within normal limits    EKG   Radiology No results found.  Procedures Procedures (including critical care time)  Medications Ordered in UC Medications - No data to display  Initial Impression / Assessment and Plan / UC Course  I have reviewed the triage vital signs and the nursing notes.  Pertinent labs & imaging results that were available during my care of the patient were reviewed by me and considered in my medical decision making (see chart for details).     Left-sided lower back pain-no mechanism of injury, do not suspect underlying fracture, does have tick bite in this area, but no surrounding signs of cellulitis, no other signs or symptoms concerning for Lyme/RMSF.  Deferring blood work for this, providing triamcinolone to apply topically over tick bite.  Recommended Naprosyn and tizanidine to use as needed for lower back/gluteal discomfort, gentle stretching alternating ice and heat.  No neurodeficits, no weakness noted in extremities.  UA with only trace hemoglobin, do not suspect underlying stone or UTI.  Discussed strict return precautions. Patient verbalized understanding and is agreeable with plan.  Final Clinical Impressions(s) / UC Diagnoses   Final diagnoses:  Acute left-sided low back pain without sciatica     Discharge Instructions      Please begin Naprosyn twice daily with food Supplement tizanidine at  home/bedtime-this is a muscle relaxer, may cause drowsiness, do not drive or work after taking Gentle stretching-see attached Alternate ice and heat Triamcinolone to take by twice daily as needed Follow-up with sports medicine/orthopedics if symptoms persistent      ED Prescriptions     Medication Sig Dispense Auth. Provider   naproxen (NAPROSYN) 500 MG tablet Take 1 tablet (500 mg total) by mouth 2 (two) times daily. 30 tablet Criss Pallone C, PA-C   tiZANidine (ZANAFLEX) 2 MG tablet Take 1-2 tablets (2-4 mg total) by mouth every  6 (six) hours as needed for muscle spasms. 30 tablet Jerni Selmer C, PA-C   triamcinolone cream (KENALOG) 0.1 % Apply 1 application topically 2 (two) times daily. 30 g Jaylnn Ullery, [place] C, PA-C      PDMP not reviewed this encounter.   Lew Dawes, [place] 06/05/21 [phone]

## 2021-06-05 NOTE — ED Triage Notes (Signed)
C/O left buttock pain onset approx 1 wk ago with progressive worsening.  Denies injury. Reports having had a tick bite to same area approx 1 month ago.  Denies parasthesias.  Denies fevers.  Denies rash. Also c/o polyuria x approx 2 wks without dysuria. Pt ambulates without difficulty.

## 2021-06-05 NOTE — Discharge Instructions (Addendum)
Please begin Naprosyn twice daily with food Supplement tizanidine at home/bedtime-this is a muscle relaxer, may cause drowsiness, do not drive or work after taking Gentle stretching-see attached Alternate ice and heat Triamcinolone to take by twice daily as needed Follow-up with sports medicine/orthopedics if symptoms persistent
